# Patient Record
Sex: Female | Born: 1995 | Race: White | Hispanic: No | Marital: Single | Smoking: Never smoker
Health system: Southern US, Community
[De-identification: ages and names within clinical notes are randomized; demographics above are authoritative.]

## PROBLEM LIST (undated history)

## (undated) DIAGNOSIS — T7840XA Allergy, unspecified, initial encounter: Secondary | ICD-10-CM

## (undated) HISTORY — DX: Allergy, unspecified, initial encounter: T78.40XA

## (undated) HISTORY — PX: NO PAST SURGERIES: SHX2092

---
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
## 2016-01-18 ENCOUNTER — Ambulatory Visit (INDEPENDENT_AMBULATORY_CARE_PROVIDER_SITE_OTHER): Payer: BLUE CROSS/BLUE SHIELD | Admitting: Family Medicine

## 2016-01-18 ENCOUNTER — Encounter: Payer: Self-pay | Admitting: Family Medicine

## 2016-01-18 DIAGNOSIS — D509 Iron deficiency anemia, unspecified: Secondary | ICD-10-CM

## 2016-01-18 DIAGNOSIS — E559 Vitamin D deficiency, unspecified: Secondary | ICD-10-CM

## 2016-01-19 LAB — CBC WITH DIFFERENTIAL/PLATELET
BASOS: 1 %
Basophils Absolute: 0.1 10*3/uL (ref 0.0–0.2)
EOS (ABSOLUTE): 0.1 10*3/uL (ref 0.0–0.4)
Eos: 1 %
Hematocrit: 36.6 % (ref 34.0–46.6)
Hemoglobin: 12.6 g/dL (ref 11.1–15.9)
IMMATURE GRANS (ABS): 0 10*3/uL (ref 0.0–0.1)
IMMATURE GRANULOCYTES: 0 %
LYMPHS: 31 %
Lymphocytes Absolute: 1.9 10*3/uL (ref 0.7–3.1)
MCH: 30.7 pg (ref 26.6–33.0)
MCHC: 34.4 g/dL (ref 31.5–35.7)
MCV: 89 fL (ref 79–97)
MONOS ABS: 0.4 10*3/uL (ref 0.1–0.9)
Monocytes: 6 %
NEUTROS PCT: 61 %
Neutrophils Absolute: 3.7 10*3/uL (ref 1.4–7.0)
Platelets: 252 10*3/uL (ref 150–379)
RBC: 4.11 x10E6/uL (ref 3.77–5.28)
RDW: 13.6 % (ref 12.3–15.4)
WBC: 6 10*3/uL (ref 3.4–10.8)

## 2016-01-19 LAB — FERRITIN: Ferritin: 15 ng/mL (ref 15–77)

## 2016-01-19 LAB — IRON AND TIBC
IRON SATURATION: 22 % (ref 15–55)
IRON: 89 ug/dL (ref 27–159)
Total Iron Binding Capacity: 396 ug/dL (ref 250–450)
UIBC: 307 ug/dL (ref 131–425)

## 2016-01-19 LAB — VITAMIN D 25 HYDROXY (VIT D DEFICIENCY, FRACTURES): Vit D, 25-Hydroxy: 31.9 ng/mL (ref 30.0–100.0)

## 2016-02-22 ENCOUNTER — Ambulatory Visit (INDEPENDENT_AMBULATORY_CARE_PROVIDER_SITE_OTHER): Payer: BLUE CROSS/BLUE SHIELD | Admitting: Family Medicine

## 2016-02-22 ENCOUNTER — Encounter: Payer: Self-pay | Admitting: Family Medicine

## 2016-02-22 VITALS — BP 120/74 | HR 58 | Temp 98.1°F | Resp 14

## 2016-02-22 DIAGNOSIS — R040 Epistaxis: Secondary | ICD-10-CM

## 2016-02-22 NOTE — Progress Notes (Signed)
Patient presents today for chronic nosebleeds. Patient states that she has had problems since the age of 89. Recently with the cold air she has noticed more episodes. She does use a humidifier and Vaseline. Denies being unable to breathe from either nostril.  ROS: Negative except mentioned above. Vitals as per Epic.  GENERAL: NAD HEENT: no pharyngeal erythema, no exudate, no erythema of TMs, no acute epistaxis, no clots noted in the nostrils, no cervical LAD RESP: CTA B CARD: RRR NEURO: CN II-XII grossly intact   A/P: Chronic epistaxis - Will refer patient to ENT as soon as possible, continue with management with humidifier, Vaseline for now. May require cauterization.

## 2016-02-25 ENCOUNTER — Ambulatory Visit (INDEPENDENT_AMBULATORY_CARE_PROVIDER_SITE_OTHER): Payer: BLUE CROSS/BLUE SHIELD | Admitting: Family Medicine

## 2016-02-25 ENCOUNTER — Encounter: Payer: Self-pay | Admitting: Family Medicine

## 2016-02-25 VITALS — BP 113/63 | HR 96 | Temp 98.8°F | Resp 14

## 2016-02-25 DIAGNOSIS — B349 Viral infection, unspecified: Secondary | ICD-10-CM

## 2016-02-25 MED ORDER — OSELTAMIVIR PHOSPHATE 75 MG PO CAPS
75.0000 mg | ORAL_CAPSULE | Freq: Two times a day (BID) | ORAL | 0 refills | Status: DC
Start: 2016-02-25 — End: 2016-09-14

## 2016-02-25 NOTE — Progress Notes (Signed)
Patient presents today with symptoms of nasal drainage, cough, myalgias, fever, chills. Patient states that she's had the symptoms for about 2 days. She denies any chest pain or shortness of breath. She has been taking over-the-counter medications for symptoms.  ROS: Negative except mentioned above. Vitals as per Epic. GENERAL: NAD HEENT: no pharyngeal erythema, no exudate, no erythema of TMs, no cervical LAD RESP: CTA B CARD: RRR NEURO: CN II-XII grossly intact   A/P: Viral Illness - discussed with patient likely related to influenza, patient can take Tamiflu if she wants given timeframe of presentation, over-the-counter medication such as Tylenol/ibuprofen when necessary, Delsym when necessary, Claritin when necessary. No athletic activity or class until afebrile for at least 24 hours without fever lowering medication. Seek medical attention if symptoms persist or worsen.

## 2016-02-27 ENCOUNTER — Encounter: Payer: Self-pay | Admitting: Emergency Medicine

## 2016-02-27 ENCOUNTER — Emergency Department
Admission: EM | Admit: 2016-02-27 | Discharge: 2016-02-27 | Disposition: A | Discharge status: Home / Self Care | Payer: BLUE CROSS/BLUE SHIELD | Attending: Emergency Medicine | Admitting: Emergency Medicine

## 2016-02-27 DIAGNOSIS — R05 Cough: Secondary | ICD-10-CM

## 2016-02-27 DIAGNOSIS — K0889 Other specified disorders of teeth and supporting structures: Secondary | ICD-10-CM | POA: Diagnosis not present

## 2016-02-27 DIAGNOSIS — Z79899 Other long term (current) drug therapy: Secondary | ICD-10-CM | POA: Diagnosis not present

## 2016-02-27 DIAGNOSIS — R059 Cough, unspecified: Secondary | ICD-10-CM

## 2016-02-27 DIAGNOSIS — J111 Influenza due to unidentified influenza virus with other respiratory manifestations: Secondary | ICD-10-CM

## 2016-02-27 MED ORDER — BENZONATATE 100 MG PO CAPS: 200.0000 mg | ORAL_CAPSULE | Freq: Three times a day (TID) | ORAL | 0 refills | Status: DC | PRN

## 2016-02-27 NOTE — ED Triage Notes (Addendum)
Pt reports sinus sxs started about 1.5 weeks ago, and flu-like sxs started on Tuesday. Pt has been taking Tamiflu. Pt took 2 Tylenol PTA.  C/o left ear pain and nasal congestion today. Pt states she also started wearing her retainer which she states has caused her to have tooth pain as well on the left.

## 2016-02-27 NOTE — Discharge Instructions (Signed)
Continue taking Tamiflu as directed. Continue ibuprofen or Tylenol as needed for muscle aches and dental pain. Obtain Sudafed PE for nasal congestion or use saline nose spray for nasal congestion. Begin taking Tessalon Perles 2 every 8 hours as needed for cough. Continue to drink lots of fluids. Follow-up with [facility] if any continued problems.

## 2016-02-27 NOTE — ED Provider Notes (Signed)
[facility] Emergency Department Provider Note  ____________________________________________   First MD Initiated Contact with Patient 02/27/16 [phone]     (approximate)  I have reviewed the triage vital signs and the nursing notes.   HISTORY  Chief Complaint Nasal Congestion; Otalgia; and Dental Pain   HPI Linda Garrison is a 21 y.o. female is here with complaint of nasal congestion, left ear pain, left dental pain.Patient states that this just recently started. Patient was diagnosed with influenza on 2/13 and begin taking Tamiflu that night. Patient admits to cough  which is occasional productive. Patient states she was not given any medication for cough. She has been taking ibuprofen until she ran out and now is taking acetaminophen. She also is wearing her retainer which causes more pain to her left tooth. She is not taking any medications specifically for congestion. [facility].   Past Medical History:  Diagnosis Date  . Allergy    seasonal    There are no active problems to display for this patient.   History reviewed. No pertinent surgical history.  Prior to Admission medications   Medication Sig Start Date End Date Taking? Authorizing Provider  benzonatate (TESSALON PERLES) 100 MG capsule Take 2 capsules (200 mg total) by mouth 3 (three) times daily as needed. 02/27/16 02/26/17  Tommi Rumpshonda L Summers, PA-C  oseltamivir (TAMIFLU) 75 MG capsule Take 1 capsule (75 mg total) by mouth 2 (two) times daily. 02/25/16   Jolene ProvostKirtida Patel, MD  phenyltoloxamine-acetaminophen 50-500 MG tablet Take 1 tablet by mouth every 4 (four) hours as needed for pain.    Historical Provider, MD    Allergies Patient has no known allergies.  Family History  Problem Relation Age of Onset  . Diabetes Paternal Aunt     Social History Social History  Substance Use Topics  . Smoking status: Never Smoker  . Smokeless tobacco: Never Used  . Alcohol  use No    Review of Systems Constitutional: Improving fever/chills Eyes: No visual changes. ENT: No sore throat. Positive left upper dental pain. Positive left ear pain. Positive nasal congestion. Cardiovascular: Denies chest pain. Respiratory: Denies shortness of breath. Positive cough Gastrointestinal: No abdominal pain.  No nausea, no vomiting.  No diarrhea.  Musculoskeletal: Negative for back pain. Skin: Negative for rash. Neurological: Negative for headaches, focal weakness or numbness.  10-point ROS otherwise negative.  ____________________________________________   PHYSICAL EXAM:  VITAL SIGNS: ED Triage Vitals  Enc Vitals Group     BP 02/27/16 0816 116/77     Pulse Rate 02/27/16 0816 95     Resp 02/27/16 0816 18     Temp 02/27/16 0816 99.4 F (37.4 C)     Temp Source 02/27/16 0816 Oral     SpO2 02/27/16 0816 97 %     Weight 02/27/16 0818 130 lb (59 kg)     Height 02/27/16 0818 5\' 6"  (1.676 m)     Head Circumference --      Peak Flow --      Pain Score --      Pain Loc --      Pain Edu? --      Excl. in GC? --     Constitutional: Alert and oriented. Well appearing and in no acute distress. Eyes: Conjunctivae are normal. PERRL. EOMI. Head: Atraumatic. Nose: Mild congestion/rhinnorhea.  EACs are clear. TMs are dull. No injection or erythema noted. Mouth/Throat: Mucous membranes are moist.  Oropharynx non-erythematous. No obvious dental  Cary noted. Gums are nontender to palpation. No edema present. No obvious abscess seen. Neck: No stridor.   Hematological/Lymphatic/Immunilogical: No cervical lymphadenopathy. Cardiovascular: Normal rate, regular rhythm. Grossly normal heart sounds.  Good peripheral circulation. Respiratory: Normal respiratory effort.  No retractions. Lungs CTAB. Gastrointestinal: Soft and nontender. No distention.  Musculoskeletal: Moves upper and lower extremities without any difficulty. Normal gait was noted. Neurologic:  Normal speech and  language. No gross focal neurologic deficits are appreciated. No gait instability. Skin:  Skin is warm, dry and intact. No rash noted. Psychiatric: Mood and affect are normal. Speech and behavior are normal.  ____________________________________________   LABS (all labs ordered are listed, but only abnormal results are displayed)  Labs Reviewed - No data to display   PROCEDURES  Procedure(s) performed: None  Procedures  Critical Care performed: No  ____________________________________________   INITIAL IMPRESSION / ASSESSMENT AND PLAN / ED COURSE  Pertinent labs & imaging results that were available during my care of the patient were reviewed by me and considered in my medical decision making (see chart for details).  She is continue taking Tamiflu as directed. She is also encouraged to take Tylenol or ibuprofen as needed for muscle aches and dental pain. She is to also obtain Sudafed PE for nasal congestion or use saline nose spray as needed. She was given a prescription for Tessalon Perles to every 8 hours as needed for cough. She is encouraged to drink lots of fluids. She is follow-up with [facility] if any continued problems.      ____________________________________________   FINAL CLINICAL IMPRESSION(S) / ED DIAGNOSES  Final diagnoses:  Influenza  Cough      NEW MEDICATIONS STARTED DURING THIS VISIT:  Discharge Medication List as of 02/27/2016  9:59 AM    START taking these medications   Details  benzonatate (TESSALON PERLES) 100 MG capsule Take 2 capsules (200 mg total) by mouth 3 (three) times daily as needed., Starting Sat 02/27/2016, Until Sun 02/26/2017, Print         Note:  This document was prepared using Dragon voice recognition software and may include unintentional dictation errors.    Tommi Rumps, PA-C 02/27/16 1529    Nita Sickle, MD 02/27/16 [phone]

## 2016-03-10 NOTE — Progress Notes (Signed)
Patient is here for follow-up regarding her vitamin D deficiency and low ferritin levels in the past. Patient currently is recovering from a stress reaction to her femur. She states that her activity level is progressing slowly without problems. She denies any pain at this time with rest or with activity. She is unsure of the vitamin D supplements she is taking. She is not taking iron on a regular basis. She denies any problems with her menstrual cycle or a restrictive diet. She denies any significant weight loss or weight gain.  ROS: Negative except mentioned above. Vitals as per Epic.  GENERAL: NAD HEENT: no pharyngeal erythema, no exudate, no erythema of TMs, no cervical LAD RESP: CTA B CARD: RRR NEURO: CN II-XII grossly intact   A/P: Vitamin D Deficiency, low ferritin - will repeat labs today, continue slow progression back to activity with running, discussed having off days and cross training days built in to the week, she is to inform the coach/trainer if she starts to have any pain with activity. Will discuss supplementation with vitamin D and/or iron after results have been reviewed. Recommend seeing nutritionist as well.

## 2016-04-15 ENCOUNTER — Ambulatory Visit (INDEPENDENT_AMBULATORY_CARE_PROVIDER_SITE_OTHER): Payer: BLUE CROSS/BLUE SHIELD | Admitting: Family Medicine

## 2016-04-15 DIAGNOSIS — M79606 Pain in leg, unspecified: Secondary | ICD-10-CM

## 2016-04-18 ENCOUNTER — Ambulatory Visit
Admission: RE | Admit: 2016-04-18 | Discharge: 2016-04-18 | Disposition: A | Discharge status: Home / Self Care | Payer: BLUE CROSS/BLUE SHIELD | Source: Ambulatory Visit | Attending: Family Medicine | Admitting: Family Medicine

## 2016-04-18 ENCOUNTER — Ambulatory Visit (INDEPENDENT_AMBULATORY_CARE_PROVIDER_SITE_OTHER): Payer: BLUE CROSS/BLUE SHIELD | Admitting: Family Medicine

## 2016-04-18 DIAGNOSIS — M898X5 Other specified disorders of bone, thigh: Secondary | ICD-10-CM

## 2016-04-18 NOTE — Progress Notes (Signed)
Patient presents today for follow-up regarding her right femur pain. Patient is here to have her vitamin D level drawn. She states that she is still having pain at times with walking. She did cross train for over 200 minutes over the weekend. She admits that she had no pain while cross training. Given her history of stress injury in the past would recommend moving forward with doing imaging. I have asked that she rest and not do any activity for now. I would like her on crutches if she is having pain with weightbearing. Patient addresses understanding of plan. Will discuss with trainer.

## 2016-04-18 NOTE — Progress Notes (Signed)
Note scanned in.

## 2016-04-19 ENCOUNTER — Ambulatory Visit: Payer: BLUE CROSS/BLUE SHIELD

## 2016-04-19 ENCOUNTER — Ambulatory Visit
Admission: RE | Admit: 2016-04-19 | Discharge: 2016-04-19 | Disposition: A | Discharge status: Home / Self Care | Payer: BLUE CROSS/BLUE SHIELD | Source: Ambulatory Visit | Attending: Family Medicine | Admitting: Family Medicine

## 2016-04-19 DIAGNOSIS — M898X5 Other specified disorders of bone, thigh: Secondary | ICD-10-CM | POA: Diagnosis not present

## 2016-04-19 LAB — VITAMIN D 25 HYDROXY (VIT D DEFICIENCY, FRACTURES): Vit D, 25-Hydroxy: 46.7 ng/mL (ref 30.0–100.0)

## 2016-09-14 ENCOUNTER — Encounter: Payer: Self-pay | Admitting: Obstetrics and Gynecology

## 2016-09-14 ENCOUNTER — Ambulatory Visit (INDEPENDENT_AMBULATORY_CARE_PROVIDER_SITE_OTHER): Payer: BLUE CROSS/BLUE SHIELD | Admitting: Obstetrics and Gynecology

## 2016-09-14 VITALS — BP 92/58 | HR 74 | Ht 67.0 in | Wt 132.0 lb

## 2016-09-14 DIAGNOSIS — Z113 Encounter for screening for infections with a predominantly sexual mode of transmission: Secondary | ICD-10-CM

## 2016-09-14 DIAGNOSIS — T192XXA Foreign body in vulva and vagina, initial encounter: Secondary | ICD-10-CM

## 2016-09-14 DIAGNOSIS — N938 Other specified abnormal uterine and vaginal bleeding: Secondary | ICD-10-CM

## 2016-09-14 DIAGNOSIS — N898 Other specified noninflammatory disorders of vagina: Secondary | ICD-10-CM

## 2016-09-14 LAB — POCT WET PREP WITH KOH
CLUE CELLS WET PREP PER HPF POC: NEGATIVE
KOH Prep POC: NEGATIVE
RBC WET PREP PER HPF POC: POSITIVE
Trichomonas, UA: NEGATIVE
Yeast Wet Prep HPF POC: NEGATIVE

## 2016-09-14 NOTE — Progress Notes (Signed)
Chief Complaint  Patient presents with  . Menstrual Problem    cycles every 2 weeks and possible bacterial vaginosis    HPI:      Linda Garrison is a 21 y.o. G0P0000 who LMP was Patient's last menstrual period was 08/27/2016 (exact date)., presents today for NP eval of DUB sx and vaginal odor. Pt's menses usually monthly, lasting 4-5 days, no BTB, occas dysmen. Pt had regular menses 6/18 but then started bleeding every 2 wks. Flow is like a normal period. Pt has had small clots with bleeding now, too. She noticed about a 6# wt gain instantly this summer, and she is a college runner. No recent sickness/travel/abx use. Pt is sex active, using condoms. No pelvic pain. No UPT done.   Pt also has noticed an increased d/c and odor for about 3 wks. No vag itch. No meds to treat. No recent abx use, no LBP, belly pain, fevers, urin sx. No hx of BV. 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     Past Medical History:  Diagnosis Date  . Allergy    seasonal    Past Surgical History:  Procedure Laterality Date  . NO PAST SURGERIES      Family History  Problem Relation Age of Onset  . Diabetes Paternal Aunt   . Testicular cancer Father   . Breast cancer Neg Hx   . Hypertension Neg Hx   . Hyperlipidemia Neg Hx   . Thyroid disease Neg Hx   . Stroke Neg Hx     Social History   Social History  . Marital status: Single    Spouse name: N/A  . Number of children: N/A  . Years of education: N/A   Occupational History  . Not on file.   Social History Main Topics  . Smoking status: Never Smoker  . Smokeless tobacco: Never Used  . Alcohol use No  . Drug use: No  . Sexual activity: Yes    Birth control/ protection: None, Condom   Other Topics Concern  . Not on file   Social History Narrative  . No narrative on file    No current outpatient prescriptions on file.   ROS:  Review of Systems  Constitutional: Negative for fever.  Gastrointestinal: Negative for  blood in stool, constipation, diarrhea, nausea and vomiting.  Genitourinary: Positive for menstrual problem and vaginal discharge. Negative for dyspareunia, dysuria, flank pain, frequency, hematuria, urgency, vaginal bleeding and vaginal pain.  Musculoskeletal: Negative for back pain.  Skin: Negative for rash.     OBJECTIVE:   Vitals:  BP (!) 92/58   Pulse 74   Ht 5\' 7"  (1.702 m)   Wt 132 lb (59.9 kg)   LMP 08/27/2016 (Exact Date)   BMI 20.67 kg/m   Physical Exam  Constitutional: She is oriented to person, place, and time and well-developed, well-nourished, and in no distress. Vital signs are normal.  Genitourinary: Uterus normal, cervix normal, right adnexa normal, left adnexa normal and vulva normal. Uterus is not enlarged. Cervix exhibits no motion tenderness and no tenderness. Right adnexum displays no mass and no tenderness. Left adnexum displays no mass and no tenderness. Vulva exhibits no erythema, no exudate, no lesion, no rash and no tenderness. Vagina exhibits no lesion. Bloody  acrid and vaginal discharge found.  Genitourinary Comments: RETAINED TAMPON IN VAGINA; LOOKS FAIRLY FRESH; BRIGHT RED MENSTRUAL BLEEDING PRESENT  Neurological: She is oriented to person, place, and time.  Psychiatric: Mood,  memory, affect and judgment normal.  Vitals reviewed.   Results: Results for orders placed or performed in visit on 09/14/16 (from the past 24 hour(s))  POCT Wet Prep with KOH     Status: Normal   Collection Time: 09/14/16  4:16 PM  Result Value Ref Range   Trichomonas, UA Negative    Clue Cells Wet Prep HPF POC neg    Epithelial Wet Prep HPF POC  Few, Moderate, Many, Too numerous to count   Yeast Wet Prep HPF POC neg    Bacteria Wet Prep HPF POC  Few   RBC Wet Prep HPF POC pos    WBC Wet Prep HPF POC     KOH Prep POC Negative Negative   PT UNABLE TO GIVE URINE SPECIMEN FOR UPT  Assessment/Plan: DUB (dysfunctional uterine bleeding) - Since 6/18. Check UPT at home  (couldn't give urine specimen today)/labs/nuswab. Will f/u with results.  - Plan: TSH + free T4, Prolactin, TSH + free T4, Prolactin, Chlamydia/Gonococcus/Trichomonas, NAA  Screening for STD (sexually transmitted disease) - Plan: Chlamydia/Gonococcus/Trichomonas, NAA  Retained tampon, initial encounter - Tampon removed. Most likely cause of odor, but not bleeding since going on since 6/18. Discussed signs/sx of TSS.   Vaginal odor - See if sx resolve with tampon removal.  - Plan: POCT Wet Prep with KOH     Return if symptoms worsen or fail to improve.  Deeandra Jerry B. Rayli Wiederhold, PA-C 09/14/2016 4:20 PM

## 2016-09-16 ENCOUNTER — Encounter: Payer: Self-pay | Admitting: Obstetrics and Gynecology

## 2016-09-16 ENCOUNTER — Ambulatory Visit (INDEPENDENT_AMBULATORY_CARE_PROVIDER_SITE_OTHER): Payer: BLUE CROSS/BLUE SHIELD | Admitting: Family Medicine

## 2016-09-16 ENCOUNTER — Encounter: Payer: Self-pay | Admitting: Family Medicine

## 2016-09-16 VITALS — BP 100/63 | HR 88 | Temp 97.8°F | Resp 14

## 2016-09-16 DIAGNOSIS — D508 Other iron deficiency anemias: Secondary | ICD-10-CM

## 2016-09-16 DIAGNOSIS — E559 Vitamin D deficiency, unspecified: Secondary | ICD-10-CM

## 2016-09-16 DIAGNOSIS — N926 Irregular menstruation, unspecified: Secondary | ICD-10-CM

## 2016-09-16 LAB — CHLAMYDIA/GONOCOCCUS/TRICHOMONAS, NAA
Chlamydia by NAA: NEGATIVE
Gonococcus by NAA: NEGATIVE
Trich vag by NAA: NEGATIVE

## 2016-09-16 NOTE — Progress Notes (Signed)
Patient presents with symptoms of irregular menses for a few months. She had to 3-4 menstrual cycles within a two month period of time. She denies any significant weight loss or weight gain. She went to OB/GYN and suggested she get prolactin and TSH/T4 be checked. Patient also needs her Vitamin D and iron studies drawn. Patient does not eat red meat. She is not very compliant with her iron and Vitamin D supplement daily. She denies any bone pain and is up to about 37 miles/wk.   ROS: Negative except mentioned above.  GENERAL: NAD RESP: CTA B CARD: RRR NEURO: CN II-XII grossly intact   A/P: Irregular menses - will draw a thyroid tests and prolactin. Patient states her last menstrual period was 2 weeks ago. Will follow up with OB/GYN as scheduled.  Vitamin D deficiency, Anemia - will draw labs associated with this, encourage patient to take supplements. Will follow-up with patient once results have been reviewed.

## 2016-09-17 LAB — TSH+FREE T4
FREE T4: 1 ng/dL (ref 0.93–1.60)
TSH: 1.29 u[IU]/mL (ref 0.450–4.500)

## 2016-09-17 LAB — IRON AND TIBC
Iron Saturation: 25 % (ref 15–55)
Iron: 103 ug/dL (ref 27–159)
Total Iron Binding Capacity: 410 ug/dL (ref 250–450)
UIBC: 307 ug/dL (ref 131–425)

## 2016-09-17 LAB — PROLACTIN: PROLACTIN: 17.8 ng/mL (ref 4.8–23.3)

## 2016-09-17 LAB — VITAMIN D 25 HYDROXY (VIT D DEFICIENCY, FRACTURES): VIT D 25 HYDROXY: 37.7 ng/mL (ref 30.0–100.0)

## 2016-09-17 LAB — FERRITIN: FERRITIN: 12 ng/mL — AB (ref 15–77)

## 2016-11-17 ENCOUNTER — Ambulatory Visit (INDEPENDENT_AMBULATORY_CARE_PROVIDER_SITE_OTHER): Payer: BLUE CROSS/BLUE SHIELD | Admitting: Family Medicine

## 2016-11-17 ENCOUNTER — Encounter: Payer: Self-pay | Admitting: Family Medicine

## 2016-11-17 DIAGNOSIS — M25551 Pain in right hip: Secondary | ICD-10-CM

## 2016-11-17 MED ORDER — DICLOFENAC SODIUM 75 MG PO TBEC: 75.0000 mg | DELAYED_RELEASE_TABLET | Freq: Two times a day (BID) | ORAL | 0 refills | Status: AC

## 2016-11-18 ENCOUNTER — Other Ambulatory Visit: Payer: Self-pay | Admitting: Family Medicine

## 2016-11-18 DIAGNOSIS — M25551 Pain in right hip: Secondary | ICD-10-CM

## 2016-12-13 NOTE — Progress Notes (Signed)
Patient presents today with right hip pain. She states that she has had to use crutches due to pain with weightbearing. She states that she started have some pain while running in the meet. She denies any previous pain prior to the meet. Patient does have a history of right hip stress reaction. She denies increasing her mileage too quickly over the last few weeks. She has been changing her shoewear every 400 miles. Patient admits to normal menstrual cycles and no significant weight loss or weight gain. She denies any restrictive eating behaviors. She denies any back pain or any radicular symptoms. She denies any swelling or pain in her thigh or calf. She believes her pain is mostly in the hip flexor area. She denies any swelling or bruising. She has been taking NSAIDs intermittently for her discomfort. Patient admits to be taking vitamin D supplement daily.  ROS: Negative except mentioned above. Vitals as per Epic. GENERAL: NAD RESP: CTA B CARD: RRR MSK: RLE/R HIP - no obvious swelling or ecchymosis of the right lower extremity, mild tenderness in the proximal hip flexor area on palpation, no defect in the quad. appreciated, full range of motion, mild discomfort with hip flexion and resisted abduction, no significant pain with internal or external rotation, negative Fulcrum, antalgic gait, NV intact NEURO: CN II-XII grossly intact   A/P: Right hip pain - given patient's history of stress injury and current symptoms would recommend doing x-ray initially, continue using crutches, NSAIDs when necessary, follow-up with trainer daily to assess area and interval change, continue ice/heat,  if no significant improvement in the next few days would consider further imaging with MRI assuming x-ray is normal. Patient is to only do upper extremity activity for now. Patient addresses understanding.

## 2016-12-15 ENCOUNTER — Encounter: Payer: Self-pay | Admitting: Family Medicine

## 2016-12-15 ENCOUNTER — Ambulatory Visit (INDEPENDENT_AMBULATORY_CARE_PROVIDER_SITE_OTHER): Payer: BLUE CROSS/BLUE SHIELD | Admitting: Family Medicine

## 2016-12-15 VITALS — BP 116/68 | HR 55 | Temp 96.7°F | Resp 14

## 2016-12-15 DIAGNOSIS — E559 Vitamin D deficiency, unspecified: Secondary | ICD-10-CM

## 2016-12-15 DIAGNOSIS — D509 Iron deficiency anemia, unspecified: Secondary | ICD-10-CM

## 2016-12-15 NOTE — Progress Notes (Signed)
Patient here for iron studies and vitamin D level. Patient has no complaints today. Patient is taking any iron supplement and vitamin D supplement. Will discuss results with her when reviewed and adjust supplementation if needed. Seek medical attention if any problems.

## 2016-12-16 LAB — IRON AND TIBC
IRON SATURATION: 19 % (ref 15–55)
Iron: 65 ug/dL (ref 27–159)
Total Iron Binding Capacity: 344 ug/dL (ref 250–450)
UIBC: 279 ug/dL (ref 131–425)

## 2016-12-16 LAB — FERRITIN: Ferritin: 35 ng/mL (ref 15–150)

## 2016-12-16 LAB — VITAMIN D 25 HYDROXY (VIT D DEFICIENCY, FRACTURES): VIT D 25 HYDROXY: 29.2 ng/mL — AB (ref 30.0–100.0)

## 2017-02-02 ENCOUNTER — Ambulatory Visit (INDEPENDENT_AMBULATORY_CARE_PROVIDER_SITE_OTHER): Payer: BLUE CROSS/BLUE SHIELD | Admitting: Family Medicine

## 2017-02-02 VITALS — BP 112/58 | HR 68

## 2017-02-02 DIAGNOSIS — M79604 Pain in right leg: Secondary | ICD-10-CM

## 2017-02-03 LAB — BASIC METABOLIC PANEL
BUN/Creatinine Ratio: 17 (ref 9–23)
BUN: 13 mg/dL (ref 6–20)
CO2: 22 mmol/L (ref 20–29)
Calcium: 10 mg/dL (ref 8.7–10.2)
Chloride: 104 mmol/L (ref 96–106)
Creatinine, Ser: 0.76 mg/dL (ref 0.57–1.00)
GFR calc Af Amer: 131 mL/min/{1.73_m2} (ref >59)
GFR calc non Af Amer: 113 mL/min/{1.73_m2} (ref >59)
GLUCOSE: 84 mg/dL (ref 65–99)
POTASSIUM: 4.6 mmol/L (ref 3.5–5.2)
SODIUM: 144 mmol/L (ref 134–144)

## 2017-02-03 LAB — CK TOTAL AND CKMB (NOT AT ARMC)
CK MB INDEX: 6 ng/mL — AB (ref 0.0–5.3)
CK TOTAL: 336 U/L — AB (ref 24–173)

## 2017-02-13 ENCOUNTER — Other Ambulatory Visit: Payer: Self-pay | Admitting: Family Medicine

## 2017-02-13 DIAGNOSIS — M79651 Pain in right thigh: Secondary | ICD-10-CM

## 2017-02-14 ENCOUNTER — Ambulatory Visit: Payer: BLUE CROSS/BLUE SHIELD

## 2017-02-22 ENCOUNTER — Ambulatory Visit: Payer: BLUE CROSS/BLUE SHIELD

## 2017-02-23 ENCOUNTER — Ambulatory Visit
Admission: RE | Admit: 2017-02-23 | Discharge: 2017-02-23 | Disposition: A | Discharge status: Home / Self Care | Payer: BLUE CROSS/BLUE SHIELD | Source: Ambulatory Visit | Attending: Family Medicine | Admitting: Family Medicine

## 2017-02-23 DIAGNOSIS — M79651 Pain in right thigh: Secondary | ICD-10-CM | POA: Diagnosis not present

## 2017-02-23 DIAGNOSIS — M25551 Pain in right hip: Secondary | ICD-10-CM | POA: Diagnosis present

## 2017-02-24 ENCOUNTER — Ambulatory Visit (INDEPENDENT_AMBULATORY_CARE_PROVIDER_SITE_OTHER): Payer: PRIVATE HEALTH INSURANCE | Admitting: Family Medicine

## 2017-02-24 DIAGNOSIS — M898X5 Other specified disorders of bone, thigh: Secondary | ICD-10-CM

## 2017-02-24 NOTE — Progress Notes (Signed)
Patient presents today with symptoms of right leg pain. Patient states that her symptoms started a few days ago. She admits that she had extreme pain 2 days ago after running. She states she could hardly move her leg and walk. She had to use crutches. She states that today she is able to walk without crutches. She denies any pain with walking at this time. Patient denied any changes with her urine. She denies any tingling or numbness in her right lower extremity or any back pain. Patient does have a history of a right femoral stress injury in the past. She denies any increase in milage or changes in running form. She has been changing out her shoes every 400 miles or so. Patient does have a vitamin D deficiency and is not compliant daily with her vitamin D supplement. She denies any restrictive eating behavior. She states her menstrual cycles are normal. She denies any significant weight loss or weight gain. She points to the adductor muscle group as the area of pain. She denies any obvious swelling or bruising of the area.  ROS: Negative except mentioned above. Vitals as per Epic. GENERAL: NAD MSK: Right lower extremity - no obvious swelling or ecchymosis, full range of motion, negative Fulcrum test, negative hop test, mild discomfort with resisted adduction, NV intact, normal gait NEURO: CN II-XII grossly intact   A/P: Right upper leg pain - will check CK level and kidney function given acute pain patient had, it seems to have resolved now, advised patient to be on crutches if any pain with weightbearing, still suspicious patient could be developing stress injury given her history. Unsure as to the cause. Could be related to running form. Recommend seeing PT to see if any issues with this or any muscle imbalances or need for orthotics. Stressed the importance of taking her vitamin D supplement daily. Should crosstrained for now and increase activity slowly. If symptoms do persist/worsen would recommend  imaging such as MRI. Discussed above with trainer as well.

## 2017-02-27 ENCOUNTER — Other Ambulatory Visit: Payer: Self-pay | Admitting: Family Medicine

## 2017-02-27 ENCOUNTER — Ambulatory Visit (INDEPENDENT_AMBULATORY_CARE_PROVIDER_SITE_OTHER): Payer: PRIVATE HEALTH INSURANCE | Admitting: Family Medicine

## 2017-02-27 VITALS — BP 112/73 | HR 60 | Temp 98.7°F | Resp 14

## 2017-02-27 DIAGNOSIS — H1089 Other conjunctivitis: Secondary | ICD-10-CM

## 2017-02-27 DIAGNOSIS — J029 Acute pharyngitis, unspecified: Secondary | ICD-10-CM

## 2017-02-27 LAB — POCT RAPID STREP A (OFFICE): RAPID STREP A SCREEN: NEGATIVE

## 2017-02-27 MED ORDER — POLYMYXIN B-TRIMETHOPRIM 10000-0.1 UNIT/ML-% OP SOLN: 1.0000 [drp] | Freq: Four times a day (QID) | OPHTHALMIC | 0 refills | Status: AC

## 2017-02-27 MED ORDER — VITAMIN D (ERGOCALCIFEROL) 1.25 MG (50000 UNIT) PO CAPS: ORAL_CAPSULE | ORAL | 0 refills | Status: AC

## 2017-02-27 NOTE — Progress Notes (Signed)
Note given in to scanned into system.

## 2017-02-27 NOTE — Progress Notes (Signed)
Patient presents today with symptoms of red, itchy, mattie eyes for 2 days. Admits to some sore throat and loose bowel movements for two days. Denies any fever, cough, headache, vision problems. Patient does wear contact lenses.  ROS: Negative except mentioned above. Vitals as per Epic. GENERAL: NAD HEENT: mild pharyngeal erythema, no exudate, no erythema of TMs, no cervical LAD, mild erythema to bilateral conjunctiva, no discharge from the eyes RESP: CTA B CARD: RRR NEURO: CN II-XII grossly intact   A/P: Bilateral conjunctivitis -will treat with Polytrim, wash linens as discussed, wear glasses for now until symptoms have resolved, put new contact lenses and when ready, Claritin when necessary for itchy, watery eyes, seek medical attention if symptoms persist or worsen as discussed.  Sore throat - rapid strep test negative, Claritin when necessary for postnasal drip, Ibuprofen when necessary, seek medical attention if symptoms persist or worsen as discussed.

## 2017-04-25 ENCOUNTER — Ambulatory Visit (INDEPENDENT_AMBULATORY_CARE_PROVIDER_SITE_OTHER): Payer: BLUE CROSS/BLUE SHIELD | Admitting: Family Medicine

## 2017-04-25 ENCOUNTER — Encounter: Payer: Self-pay | Admitting: Family Medicine

## 2017-04-25 VITALS — BP 111/72 | HR 74 | Temp 98.8°F | Resp 14

## 2017-04-25 DIAGNOSIS — M898X5 Other specified disorders of bone, thigh: Secondary | ICD-10-CM

## 2017-04-25 NOTE — Progress Notes (Signed)
Patient presents today to discuss progression with activity. Patient is approximately 8 weeks out from stopping all running due to a stress reaction in her femur. Patient states that she has been pain-free for the last 5-6 weeks. Patient has been doing some swimming, stationary bike, walking. Her last vitamin D level and ferritin level were checked in January 2019. She admits to taking her supplements. She denies any weight gain or weight loss. [facility]  ROS: Negative except mentioned above. VIitals as per [employer].  GENERAL: NAD MSK: full range of motion of lower extremities, negative Hop test, negative Fulcrum, NV intact NEURO: CN II-XII grossly intact    A/P: Femoral Stress Reaction - patient is progressing well, will introduce more activity with cross training days and off days billed in, as patient will beat [employer] it will be easier to progress her, will continue towork closely with trainer and PT, encourage patient to continue supplements of iron and vitamin D. Seek medical attention if any further problems as discussed.

## 2017-06-20 ENCOUNTER — Encounter: Payer: Self-pay | Admitting: Family Medicine

## 2017-06-20 ENCOUNTER — Ambulatory Visit (INDEPENDENT_AMBULATORY_CARE_PROVIDER_SITE_OTHER): Payer: BLUE CROSS/BLUE SHIELD | Admitting: Family Medicine

## 2017-06-20 ENCOUNTER — Other Ambulatory Visit: Payer: Self-pay | Admitting: Family Medicine

## 2017-06-20 VITALS — BP 117/74 | HR 63 | Temp 98.1°F | Resp 14

## 2017-06-20 DIAGNOSIS — D509 Iron deficiency anemia, unspecified: Secondary | ICD-10-CM

## 2017-06-20 DIAGNOSIS — E559 Vitamin D deficiency, unspecified: Secondary | ICD-10-CM

## 2017-06-20 NOTE — Progress Notes (Signed)
Patient presents today for Vitamin D and Ferritin level check. Denies any problems. Has been taking her vitmain d and iron supplement daily. Denies any menstrual issues, weight issues, or restrictive diet. Has been working with PT regarding progression back to activity from femoral stress reaction. Denies any pain.    Exam deferred  A/P: Vitamin D Def. - will recheck level today and change supplement if needed once lab result reviewed.   Anemia- will check ferritin and iron studies, will change supplement if needed once lab results reviewed.

## 2017-06-21 LAB — IRON AND TIBC
IRON SATURATION: 19 % (ref 15–55)
Iron: 63 ug/dL (ref 27–159)
Total Iron Binding Capacity: 329 ug/dL (ref 250–450)
UIBC: 266 ug/dL (ref 131–425)

## 2017-06-21 LAB — VITAMIN D 25 HYDROXY (VIT D DEFICIENCY, FRACTURES): VIT D 25 HYDROXY: 44.4 ng/mL (ref 30.0–100.0)

## 2017-06-21 LAB — FERRITIN: FERRITIN: 55 ng/mL (ref 15–150)

## 2017-06-26 ENCOUNTER — Encounter: Payer: Self-pay | Admitting: Family Medicine

## 2017-06-26 ENCOUNTER — Ambulatory Visit (INDEPENDENT_AMBULATORY_CARE_PROVIDER_SITE_OTHER): Payer: PRIVATE HEALTH INSURANCE | Admitting: Family Medicine

## 2017-06-26 VITALS — BP 110/73 | HR 76 | Temp 98.4°F | Resp 14

## 2017-06-26 DIAGNOSIS — R5383 Other fatigue: Secondary | ICD-10-CM

## 2017-06-26 DIAGNOSIS — R590 Localized enlarged lymph nodes: Secondary | ICD-10-CM

## 2017-06-26 LAB — POCT URINE PREGNANCY: PREG TEST UR: NEGATIVE

## 2017-06-26 NOTE — Progress Notes (Signed)
She presents to with symptoms of cervical lymphadenopathy, fatigue, decreased appetite, headache for close to a week. She also has had night sweats for a few nights. Patient denies any upper respiratory symptoms, sore throat, abdominal pain, vomiting, diarrhea, nausea, severe headache, chest pain, shortness of breath photophobia, rash, urinary symptoms. Patient has not traveled out of the country recently. She denies starting any new medications. Patient recently had her ferritin and vitamin D levels checked which were normal. She denies any lymphadenopathy in the inguinal or axillary area. She denies being around a cat recently.patient is currently on her menstrual cycle. She has not taken any medications today.  ROS: Negative except mentioned above. Vitals as per Epic GENERAL: NAD HEENT: no significant pharyngeal erythema, no exudate, no erythema of TMs, cervical LAD bilaterally RESP: CTA B CARD: RRR ABD: NT, no organomegly appreciated  LYMPH: cervical LAD bilaterally, no axillary or inquinal LAD appreciated  SKIN: no obvious rash appreciated NEURO: CN II-XII grossly intact, negative meningeal signs   Urine dip: negative leukocytes, negative nitrates, 1+ protein, pH 6.0, 3+ blood (currently on menstrual cycle), SG 1.015, 5 ketones, negative glucose  A/P: Fatigue, Cervical LAD, Night Sweats - seems like viral process, will do CBC, CMP, EBV titers, TSH, rest, hydration, no athletic activity for now until results have been reviewed and discussed with patient, seek medical attention if symptoms persist or worsen as discussed. Reviewed symptoms related to meningitis. Tylenol/Ibuprofen when necessary.  Hx of low Ferritin, Vitamin D Deficiency - reviewed lab results recently done, continue supplements patient has been taking. Will repeat labs in the fall.

## 2017-06-28 LAB — CBC WITH DIFFERENTIAL/PLATELET
Basophils Absolute: 0.2 10*3/uL (ref 0.0–0.2)
Basos: 2 %
EOS (ABSOLUTE): 0 10*3/uL (ref 0.0–0.4)
Eos: 1 %
Hematocrit: 40.3 % (ref 34.0–46.6)
Hemoglobin: 13.9 g/dL (ref 11.1–15.9)
IMMATURE GRANS (ABS): 0 10*3/uL (ref 0.0–0.1)
Immature Granulocytes: 0 %
LYMPHS: 57 %
Lymphocytes Absolute: 4.2 10*3/uL — ABNORMAL HIGH (ref 0.7–3.1)
MCH: 31 pg (ref 26.6–33.0)
MCHC: 34.5 g/dL (ref 31.5–35.7)
MCV: 90 fL (ref 79–97)
MONOCYTES: 16 %
Monocytes Absolute: 1.2 10*3/uL — ABNORMAL HIGH (ref 0.1–0.9)
Neutrophils Absolute: 1.8 10*3/uL (ref 1.4–7.0)
Neutrophils: 24 %
PLATELETS: 170 10*3/uL (ref 150–450)
RBC: 4.49 x10E6/uL (ref 3.77–5.28)
RDW: 12.9 % (ref 12.3–15.4)
WBC: 7.5 10*3/uL (ref 3.4–10.8)

## 2017-06-28 LAB — COMPREHENSIVE METABOLIC PANEL
ALT: 60 IU/L — AB (ref 0–32)
AST: 64 IU/L — AB (ref 0–40)
Albumin/Globulin Ratio: 1.8 (ref 1.2–2.2)
Albumin: 4.5 g/dL (ref 3.5–5.5)
Alkaline Phosphatase: 103 IU/L (ref 39–117)
BILIRUBIN TOTAL: 0.3 mg/dL (ref 0.0–1.2)
BUN/Creatinine Ratio: 13 (ref 9–23)
BUN: 10 mg/dL (ref 6–20)
CALCIUM: 9.2 mg/dL (ref 8.7–10.2)
CHLORIDE: 104 mmol/L (ref 96–106)
CO2: 23 mmol/L (ref 20–29)
Creatinine, Ser: 0.79 mg/dL (ref 0.57–1.00)
GFR calc non Af Amer: 108 mL/min/{1.73_m2} (ref >59)
GFR, EST AFRICAN AMERICAN: 125 mL/min/{1.73_m2} (ref >59)
GLUCOSE: 92 mg/dL (ref 65–99)
Globulin, Total: 2.5 g/dL (ref 1.5–4.5)
Potassium: 4.4 mmol/L (ref 3.5–5.2)
Sodium: 139 mmol/L (ref 134–144)
TOTAL PROTEIN: 7 g/dL (ref 6.0–8.5)

## 2017-06-28 LAB — EPSTEIN-BARR VIRUS VCA ANTIBODY PANEL
EBV EARLY ANTIGEN AB, IGG: 72 U/mL — AB (ref 0.0–8.9)
EBV NA IgG: <18 U/mL (ref 0.0–17.9)
EBV VCA IGG: 79.5 U/mL — AB (ref 0.0–17.9)

## 2017-06-28 LAB — TSH: TSH: 1.39 u[IU]/mL (ref 0.450–4.500)

## 2017-06-29 ENCOUNTER — Telehealth: Payer: Self-pay | Admitting: Family Medicine

## 2017-06-29 NOTE — Telephone Encounter (Signed)
Patient informed of lab results. Symptoms appear to be related to infectious mono. Discussed rest, hydration, not skipping meals, no athletic activity until follow-up. Symptoms started around 6/10 with fatigue. F/U at end of June.     By Jolene ProvostPatel, Katherene Dinino, MD

## 2017-08-31 ENCOUNTER — Ambulatory Visit: Payer: BLUE CROSS/BLUE SHIELD | Admitting: Obstetrics and Gynecology

## 2019-09-13 IMAGING — MR MR FEMUR*R* W/O CM
4 of 5 series · 21 of 40 positions shown · non-contrast
Comparison: MRI right femur dated April 19, 2016.

CLINICAL DATA: Right hip and adductor thigh pain for the past year.
Pain is worse with running. Evaluate for stress injury.

EXAM:
MRI OF THE RIGHT FEMUR WITHOUT CONTRAST
TECHNIQUE: Multiplanar, multisequence MR imaging of the right femur was
performed. No intravenous contrast was administered.

[Series 3: T1 · axial · 6.0mm · 1.56mm/px · z∈[-175,+86]mm · 3 of 39 slices shown]
[im 5/39]
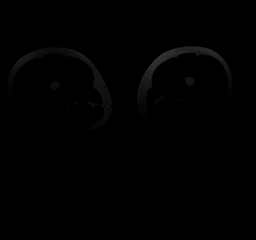
[im 22/39]
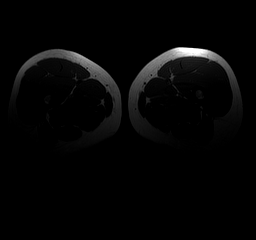
[im 34/39]
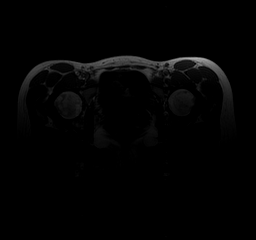

[Series 4: T2 fat-sat · axial · 6.0mm · 0.78mm/px · z∈[-211,+131]mm · 9 of 39 slices shown (1 of 3)]
[im 1/39]
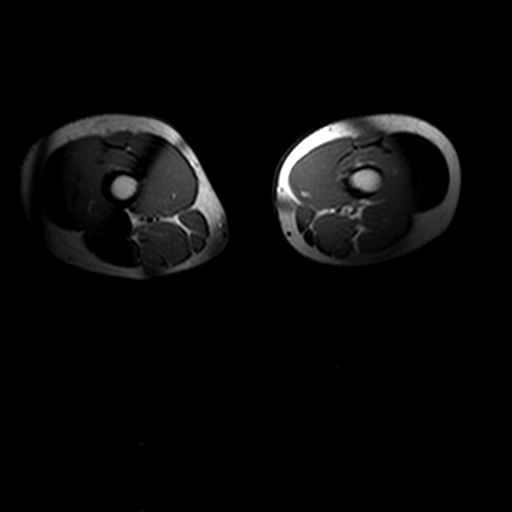
[im 5/39]
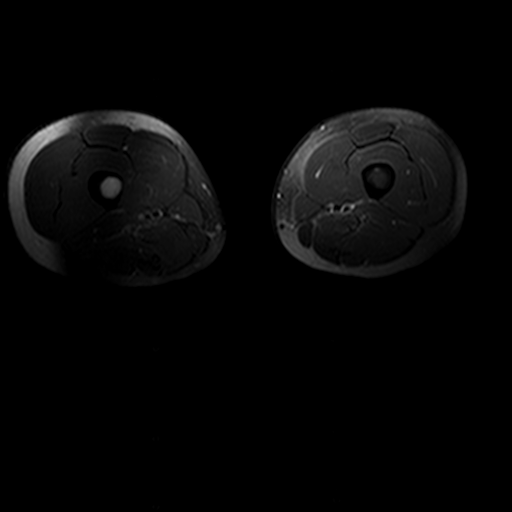
[im 10/39]
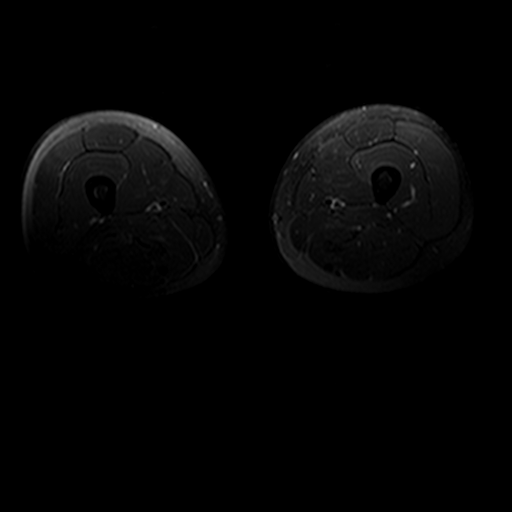
[im 15/39]
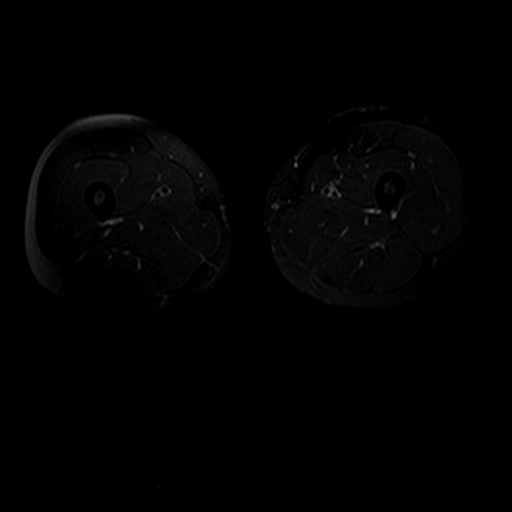
[im 20/39]
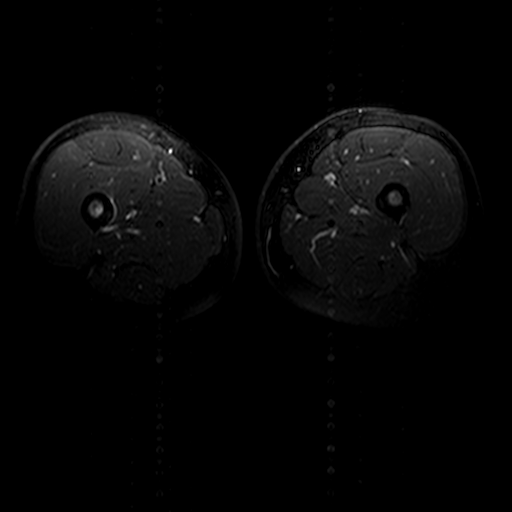
[im 24/39]
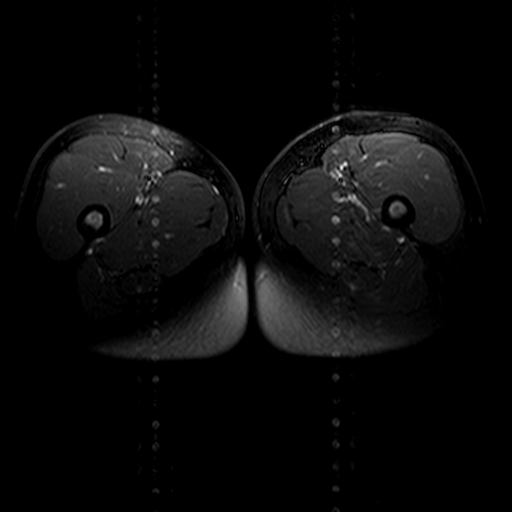
[im 29/39]
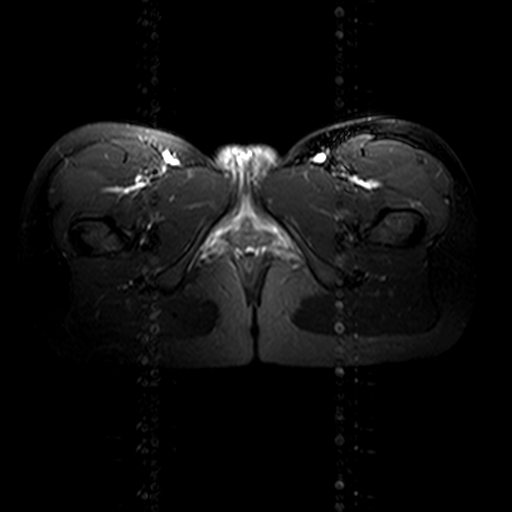
[im 34/39]
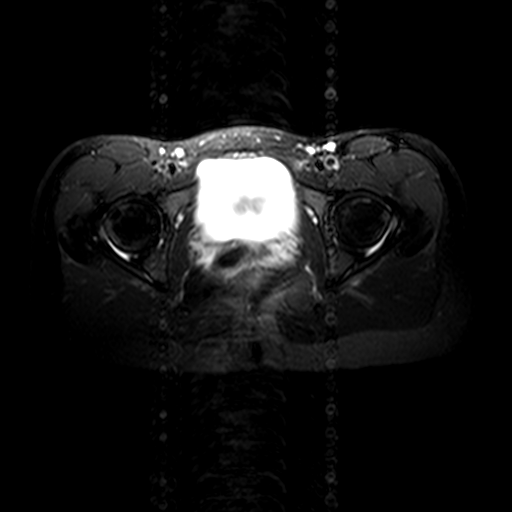
[im 39/39]
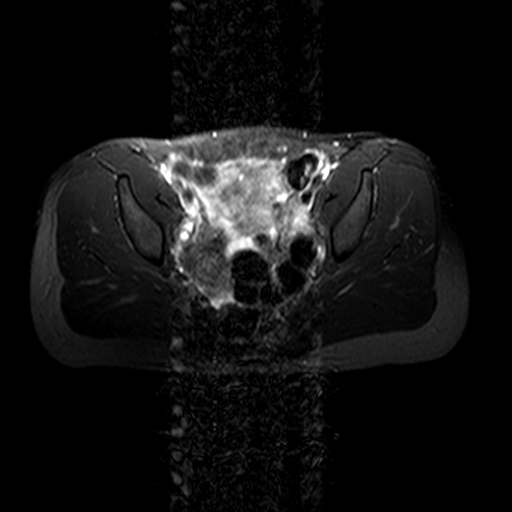

[Series 5: T2 fat-sat · coronal · 4.0mm · 0.78mm/px · 6 of 33 slices shown (2 of 3)]
[im 1/33]
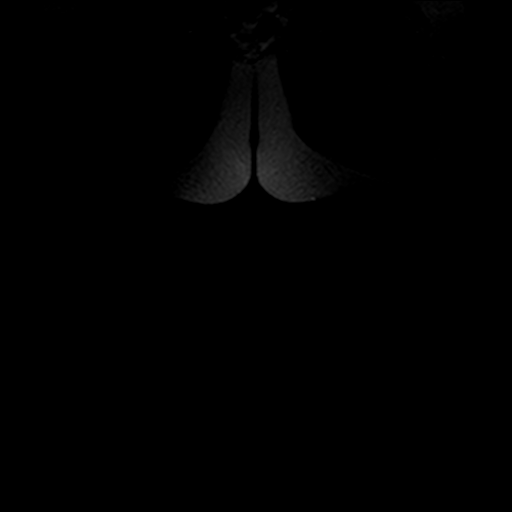
[im 5/33]
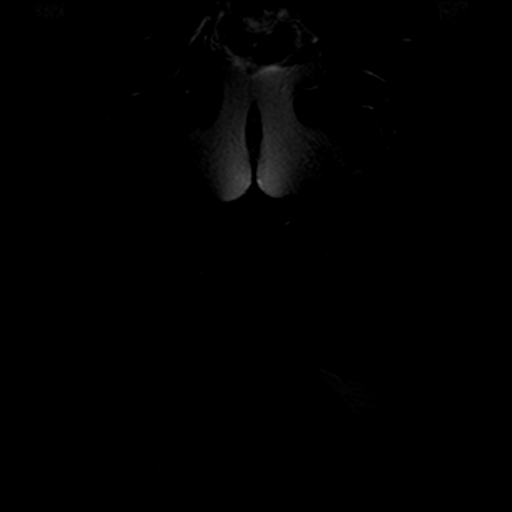
[im 10/33]
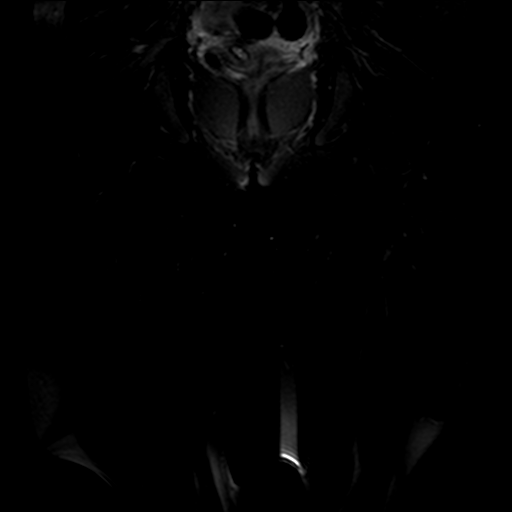
[im 14/33]
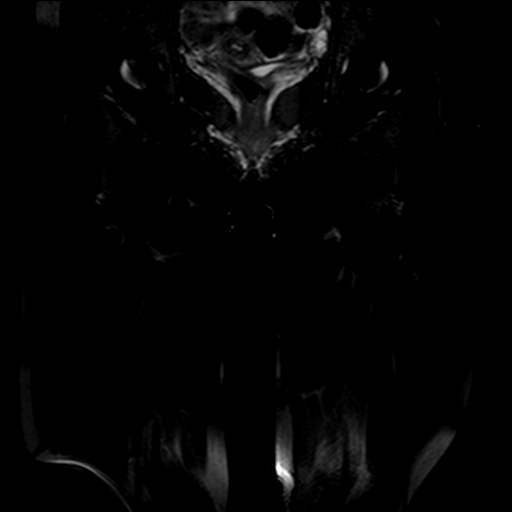
[im 19/33]
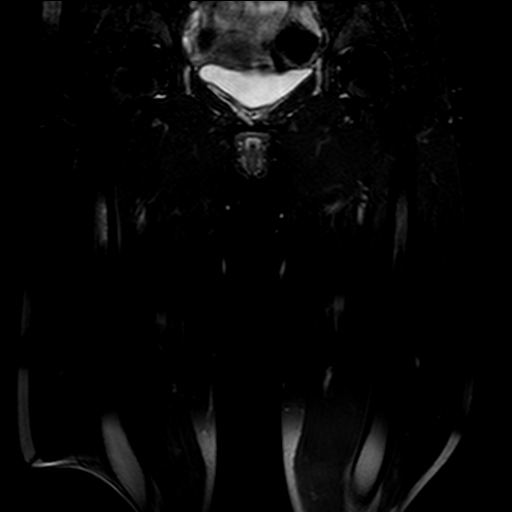
[im 28/33]
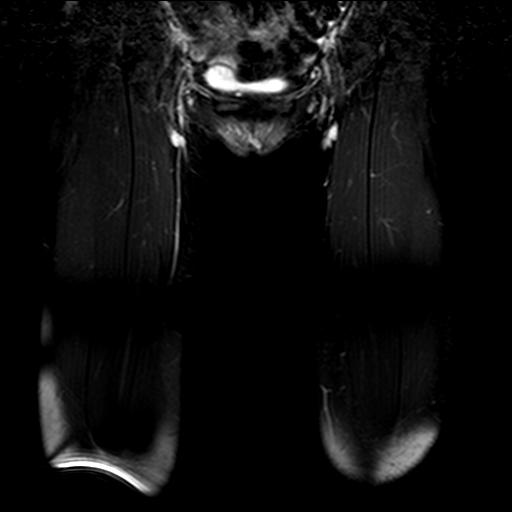

[Series 6: T2 fat-sat · sagittal · 4.0mm · 0.78mm/px · 3 of 35 slices shown (3 of 3)]
[im 5/35]
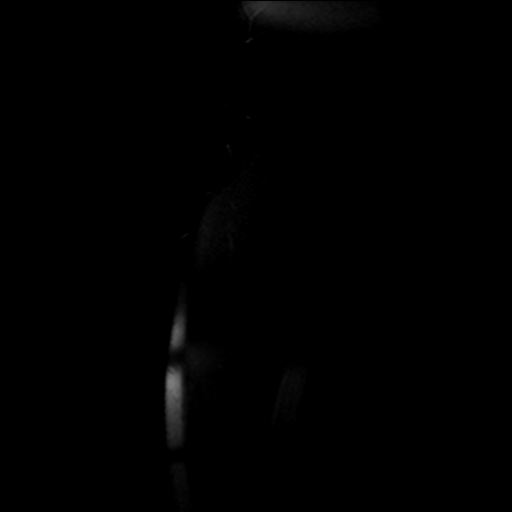
[im 20/35]
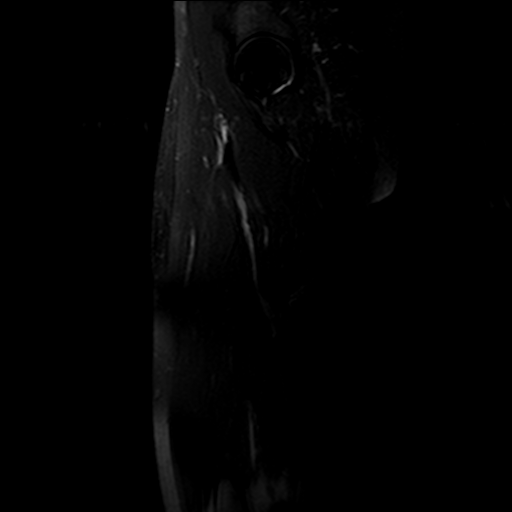
[im 30/35]
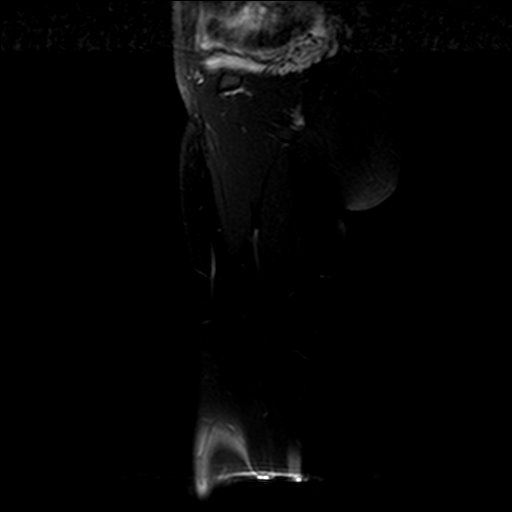

[21 of 40 positions shown; findings below may reference images not displayed]

FINDINGS: Bones/Joint/Cartilage

There is new faint marrow edema within the proximal femoral
diaphysis, spanning a CC dimension of approximately 4.5 cm. There is
adjacent periosteal reaction along the medial cortex, with slight
focal cortical thickening and abnormal T2 signal within the medial
cortex. No fracture or dislocation. Normal alignment. No joint
effusion.

Muscles and Tendons
No muscle edema or atrophy. The visualized gluteal, hamstring, and
iliopsoas tendons are unremarkable.

Soft tissue
No fluid collection or hematoma.  No soft tissue mass.
IMPRESSION: 1. Findings consistent with stress injury of the right proximal
femoral diaphysis. No fracture.

These results will be called to the ordering clinician or
representative by the Radiologist Assistant, and communication
documented in the PACS or zVision Dashboard.
